# Patient Record
Sex: Female | Born: 1995 | Race: White | Hispanic: No | Marital: Single | Smoking: Never smoker
Health system: Southern US, Community
[De-identification: ages and names within clinical notes are randomized; demographics above are authoritative.]

## PROBLEM LIST (undated history)

## (undated) DIAGNOSIS — E049 Nontoxic goiter, unspecified: Secondary | ICD-10-CM

## (undated) DIAGNOSIS — E063 Autoimmune thyroiditis: Secondary | ICD-10-CM

## (undated) DIAGNOSIS — R5383 Other fatigue: Secondary | ICD-10-CM

## (undated) DIAGNOSIS — T7840XA Allergy, unspecified, initial encounter: Secondary | ICD-10-CM

## (undated) DIAGNOSIS — N915 Oligomenorrhea, unspecified: Secondary | ICD-10-CM

## (undated) HISTORY — DX: Allergy, unspecified, initial encounter: T78.40XA

## (undated) HISTORY — DX: Other fatigue: R53.83

## (undated) HISTORY — DX: Oligomenorrhea, unspecified: N91.5

## (undated) HISTORY — DX: Nontoxic goiter, unspecified: E04.9

## (undated) HISTORY — DX: Autoimmune thyroiditis: E06.3

---
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
## 2013-04-09 ENCOUNTER — Telehealth: Payer: Self-pay | Admitting: Neurology

## 2013-04-21 ENCOUNTER — Encounter: Payer: Self-pay | Admitting: Neurology

## 2013-04-21 ENCOUNTER — Ambulatory Visit (INDEPENDENT_AMBULATORY_CARE_PROVIDER_SITE_OTHER): Payer: BC Managed Care – PPO | Admitting: Neurology

## 2013-04-21 VITALS — BP 120/73 | HR 100 | Ht 63.0 in | Wt 118.8 lb

## 2013-04-21 DIAGNOSIS — G501 Atypical facial pain: Secondary | ICD-10-CM

## 2013-04-21 DIAGNOSIS — R51 Headache: Secondary | ICD-10-CM

## 2013-04-21 DIAGNOSIS — R519 Headache, unspecified: Secondary | ICD-10-CM | POA: Insufficient documentation

## 2013-04-21 NOTE — Progress Notes (Signed)
[facility]  Provider:  Dr Allison Benton Referring Provider: Marcene Corning, MD Primary Care Physician:  Allison Quarry, MD  CC:  Headaches  HPI:  Allison Benton is a 17 y.o. female here as a referral from Dr. Tama Benton for evaluation of paresthesias in the right zygomatic arch area.   She first noted the symptoms on 2 years ago, described as a pins and needles type sensation, that comes and goes. Typically will occur every few months, lasting a few days, and it mostly continuous for those days. States medium intensity throughout. There turn at random times, no triggers, comes on quickly and subsided slowly. Does lead its own. No food triggers, not triggered by brushing teeth or combing hair when then the face. No photo phonophobia. No nausea or vomiting. As noted on the right side. She does note some tearing in her eyes, no nasal congestion no ptosis or myosis. The symptoms did start within a week after having her wisdom teeth pulled. She follow up with orthodontist who stated this was not do to surgery and instructed her to followup with neurology.  Around one month ago episode where she developed a tingling sensation in the occipital region of her head, turned into a pulsating headache. This lasted around one to 2 weeks. Had some photo and phonophobia and mild nausea with this. This headache was associated with a long cross-country car trip. She took ibuprofen, which did not help. It resolved on its own. She has had migraines intermittently in the past.   Denies any other acute or past issues. No history of visual change, no blurry vision, no pain with eye gaze. No focal motor or sensory symptoms. No gait instability. No trauma to her jaw her head.   Is taking an oral contraceptive pill, states that it is called generous FE. Has been on this for around 2 years. Is scheduled to see her OB/GYN and consider switching to a different pill.   Her father has a history of  migraines.  Review of Systems: Out of a complete 14 system review, the patient complains of only the following symptoms, and all other reviewed systems are negative. Fatigue loss of vision chest pain urinary problems headache numbness dizziness brain fog  History   Social History  . Marital Status: Single    Spouse Name: N/A    Number of Children: 0  . Years of Education: 12   Occupational History  . not employed    Social History Main Topics  . Smoking status: Never Smoker   . Smokeless tobacco: Not on file  . Alcohol Use: No  . Drug Use: No  . Sexually Active: Not on file   Other Topics Concern  . Not on file   Social History Narrative   Lives with parents. 2 older sisters. In 11th grade at [employer]. 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 Drama.     Family History  Problem Relation Age of Onset  . Hypothyroidism Mother   . Thyroid disease Mother     hashimoto  . Cancer Paternal Uncle   . Cancer Maternal Grandmother   . Hypothyroidism Maternal Grandfather   . Menstrual problems Sister   . Autoimmune disease Sister     celiac  . Menstrual problems Sister     Past Medical History  Diagnosis Date  . Goiter   . Thyroiditis, autoimmune   . Hypothyroidism, acquired, autoimmune   . Fatigue   . Oligomenorrhea   . Allergy   .  Asthma     Past Surgical History  Procedure Laterality Date  . Wisdom tooth extraction      Current Outpatient Prescriptions  Medication Sig Dispense Refill  . Atovaquone-Proguanil HCl (MALARONE PO) Take by mouth.      . levothyroxine (SYNTHROID, LEVOTHROID) 112 MCG tablet Take 112 mcg by mouth daily.        . montelukast (SINGULAIR) 10 MG tablet Take 10 mg by mouth at bedtime.        . Norethindrone-Ethinyl Estradiol-Fe (GENERESS FE) 0.8-25 MG-MCG tablet Chew 1 tablet by mouth daily.       No current facility-administered medications for this visit.    Allergies as of 04/21/2013  . (No Known Allergies)     Vitals: BP 120/73  Pulse 100  Ht 5\' 3"  (1.6 m)  Wt 118 lb 12 oz (53.865 kg)  BMI 21.04 kg/m2 Last Weight:  Wt Readings from Last 1 Encounters:  04/21/13 118 lb 12 oz (53.865 kg) (33%*, Z = -0.44)   * Growth percentiles are based on CDC 2-20 Years data.   Last Height:   Ht Readings from Last 1 Encounters:  04/21/13 5\' 3"  (1.6 m) (31%*, Z = -0.51)   * Growth percentiles are based on CDC 2-20 Years data.     Physical exam: Exam: Gen: NAD, conversant Eyes: anicteric sclerae, moist conjunctivae HENT: Atraumatic, no tenderness with palpation/percussion over maxillary, zygomatic or mastoid region. Mild tenderness over R GON region Neck: Trachea midline; supple,  Lungs: CTA, no wheezing, rales, rhonic                          CV: RRR, no MRG Abdomen: Soft, non-tender;  Extremities: No peripheral edema  Skin: Normal temperature, no rash,  Psych: Appropriate affect, pleasant  Neuro: MS: AA&Ox3, appropriately interactive, normal affect   Speech: fluent w/o paraphasic error  Memory: good recent and remote recall  CN: PERRL, fundoscopic exam wnl bilat, VFF to FC bilat, EOMI no nystagmus, no ptosis, sensation intact to LT, temp and vibration  V1-V3 bilat, face symmetric, no weakness, hearing grossly intact, palate elevates symmetrically, shoulder shrug 5/5 bilat,  tongue protrudes midline, no fasiculations noted.  Motor: normal bulk and tone Strength: 5/5  In all extremities  Coord: rapid alternating and point-to-point (FNF, HTS) movements intact.  Reflexes: symmetrical, bilat downgoing toes  Sens: LT intact in all extremities  Gait: posture, stance, stride and arm-swing normal. Tandem gait intact. Able to walk on heels and toes.    Assessment:  After physical and neurologic examination, review of laboratory studies, imaging, neurophysiology testing and pre-existing records, assessment will be reviewed on the problem list.  Plan:  Treatment plan and additional  workup will be reviewed under Problem List.  [name] is a pleasant 17 year old woman who presents for initial evaluation of around 2 year history of intermittent paresthesias involving the right zygomatic area of her face. She reports the symptoms began shortly after having her wisdom teeth extracted. They occur intermittently every few months, and typically last a few days. Are described as a mild pins and needles type sensation. With these symptoms she does have occasional tearing of her eye, there is no other autonomic features. There is no associated photo or phonophobia, no nausea vomiting. These events are not triggered by touch or any other environmental stimuli. Recently also had an episode of headache is occipital in location, pounding with associated nausea, photo and phonophobia.  Unclear etiology of her  facial paresthesias. Based on the time course of symptom onset there is concern that this may be related to the tooth extraction. Other possibilities  include a mild trigeminal neuralgia. 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 This would be atypical for this though. Less likely would be the possibility this is an early symptom of multiple sclerosis. Based on the lack of other supportive features this is less likely though it should be considered. Most recent headache is more consistent with a diagnosis of migraine, this could be partly attributed to her oral contraceptive pill.  Due to the infrequent nature of his episodes, there is no indication for medical treatment at this time. This was discussed with the patient and her mother who are in agreement. We did discuss the possibility, though very unlikely, that this may represent multiple sclerosis. I discussed the possibility of doing a MRI of the brain to check for signs of MS. After discussing the possible risks  the benefit of doing this procedure the patient and her mother decided to hold off at this time.  We discussed that in the future if there are any changes  I would recommend further workup. She will discuss the possibility of changing oral contraceptive pills with her OB/GYN at her followup visit.  1)Atypical facial pain  2)Headache(784.0)

## 2013-04-21 NOTE — Patient Instructions (Signed)
Overall you are doing fairly well but I do want to suggest a few things today:   Your symptoms today are most consistent with a diagnosis of trigeminal neuralgia which is typically a benign condition due to irritation of a cranial nerve.  As far as your medications are concerned, I would like to suggest you consider switching your oral contraceptive pill to a different medication  Follow up as needed. Please call us with any interim questions, concerns, problems, updates or refill requests.   Please also call us for any test results so we can go over those with you on the phone.  My clinical assistant and will answer any of your questions and relay your messages to me and also relay most of my messages to you.   Our phone number is [phone]. We also have an after hours call service for urgent matters and there is a physician on-call for urgent questions. For any emergencies you know to call 911 or go to the nearest emergency room

## 2013-04-27 ENCOUNTER — Telehealth: Payer: Self-pay | Admitting: Neurology

## 2013-04-27 ENCOUNTER — Other Ambulatory Visit: Payer: Self-pay | Admitting: Neurology

## 2013-04-27 DIAGNOSIS — G501 Atypical facial pain: Secondary | ICD-10-CM

## 2013-04-27 NOTE — Telephone Encounter (Signed)
Left message of MRI order per Dr Hosie Poisson

## 2013-04-27 NOTE — Telephone Encounter (Signed)
Please call Allison Benton and let her know I am placing the order for a MRI now. Thanks.

## 2013-12-09 NOTE — Telephone Encounter (Signed)
Closing encounter
